# Patient Record
Sex: Male | Born: 1985 | Race: White | Hispanic: Yes | Marital: Single | Smoking: Never smoker
Health system: Southern US, Community
[De-identification: ages and names within clinical notes are randomized; demographics above are authoritative.]

## PROBLEM LIST (undated history)

## (undated) DIAGNOSIS — K5792 Diverticulitis of intestine, part unspecified, without perforation or abscess without bleeding: Secondary | ICD-10-CM

## (undated) DIAGNOSIS — G4730 Sleep apnea, unspecified: Secondary | ICD-10-CM

## (undated) HISTORY — DX: Diverticulitis of intestine, part unspecified, without perforation or abscess without bleeding: K57.92

## (undated) HISTORY — DX: Sleep apnea, unspecified: G47.30

---

## 2019-02-26 ENCOUNTER — Other Ambulatory Visit: Payer: Self-pay

## 2019-02-26 ENCOUNTER — Encounter: Payer: Self-pay | Admitting: Cardiology

## 2019-02-26 ENCOUNTER — Ambulatory Visit (INDEPENDENT_AMBULATORY_CARE_PROVIDER_SITE_OTHER): Payer: Medicaid Other | Admitting: Cardiology

## 2019-02-26 DIAGNOSIS — F209 Schizophrenia, unspecified: Secondary | ICD-10-CM

## 2019-02-26 DIAGNOSIS — R0789 Other chest pain: Secondary | ICD-10-CM

## 2019-02-26 DIAGNOSIS — R9431 Abnormal electrocardiogram [ECG] [EKG]: Secondary | ICD-10-CM

## 2019-02-26 NOTE — Patient Instructions (Signed)
Medication Instructions:  Your physician recommends that you continue on your current medications as directed. Please refer to the Current Medication list given to you today.  *If you need a refill on your cardiac medications before your next appointment, please call your pharmacy*  Lab Work: Your physician recommends that you return for lab work today: Troponin I, lipids   If you have labs (blood work) drawn today and your tests are completely normal, you will receive your results only by: Marland Kitchen MyChart Message (if you have MyChart) OR . A paper copy in the mail If you have any lab test that is abnormal or we need to change your treatment, we will call you to review the results.  Testing/Procedures: Your physician has requested that you have an echocardiogram. Echocardiography is a painless test that uses sound waves to create images of your heart. It provides your doctor with information about the size and shape of your heart and how well your heart's chambers and valves are working. This procedure takes approximately one hour. There are no restrictions for this procedure.  Non-Cardiac CT scanning, (CAT scanning), is a noninvasive, special x-ray that produces cross-sectional images of the body using x-rays and a computer. CT scans help physicians diagnose and treat medical conditions. For some CT exams, a contrast material is used to enhance visibility in the area of the body being studied. CT scans provide greater clarity and reveal more details than regular x-ray exams.    Follow-Up: At [facility], you and your health needs are our priority.  As part of our continuing mission to provide you with exceptional heart care, we have created designated Provider Care Teams.  These Care Teams include your primary Cardiologist (physician) and Advanced Practice Providers (APPs -  Physician Assistants and Nurse Practitioners) who all work together to provide you with the care you need, when you need it.   Your next appointment:   6 week fu   The format for your next appointment:   In Person  Provider:   You will see Dr. Bing Matter.  Or, you can be scheduled with the following Advanced Practice Provider on your designated Care Team (at our [facility]):  Gillian Shields, FNP    Other Instructions   Echocardiogram An echocardiogram is a procedure that uses painless sound waves (ultrasound) to produce an image of the heart. Images from an echocardiogram can provide important information about:  Signs of coronary artery disease (CAD).  Aneurysm detection. An aneurysm is a weak or damaged part of an artery wall that bulges out from the normal force of blood pumping through the body.  Heart size and shape. Changes in the size or shape of the heart can be associated with certain conditions, including heart failure, aneurysm, and CAD.  Heart muscle function.  Heart valve function.  Signs of a past heart attack.  Fluid buildup around the heart.  Thickening of the heart muscle.  A tumor or infectious growth around the heart valves. Tell a health care provider about:  Any allergies you have.  All medicines you are taking, including vitamins, herbs, eye drops, creams, and over-the-counter medicines.  Any blood disorders you have.  Any surgeries you have had.  Any medical conditions you have.  Whether you are pregnant or may be pregnant. What are the risks? Generally, this is a safe procedure. However, problems may occur, including:  Allergic reaction to dye (contrast) that may be used during the procedure. What happens before the procedure? No  specific preparation is needed. You may eat and drink normally. What happens during the procedure?   An IV tube may be inserted into one of your veins.  You may receive contrast through this tube. A contrast is an injection that improves the quality of the pictures from your heart.  A gel will be applied to your chest.   A wand-like tool (transducer) will be moved over your chest. The gel will help to transmit the sound waves from the transducer.  The sound waves will harmlessly bounce off of your heart to allow the heart images to be captured in real-time motion. The images will be recorded on a computer. The procedure may vary among health care providers and hospitals. What happens after the procedure?  You may return to your normal, everyday life, including diet, activities, and medicines, unless your health care provider tells you not to do that. Summary  An echocardiogram is a procedure that uses painless sound waves (ultrasound) to produce an image of the heart.  Images from an echocardiogram can provide important information about the size and shape of your heart, heart muscle function, heart valve function, and fluid buildup around your heart.  You do not need to do anything to prepare before this procedure. You may eat and drink normally.  After the echocardiogram is completed, you may return to your normal, everyday life, unless your health care provider tells you not to do that. This information is not intended to replace advice given to you by your health care provider. Make sure you discuss any questions you have with your health care provider. Document Released: 04/14/2000 Document Revised: 08/08/2018 Document Reviewed: 05/20/2016 Elsevier Patient Education  2020 [employer].

## 2019-02-26 NOTE — Addendum Note (Signed)
Addended by: Ashok Norris on: 02/26/2019 11:26 AM   Modules accepted: Orders

## 2019-02-26 NOTE — Addendum Note (Signed)
Addended by: Ashok Norris on: 02/26/2019 11:19 AM   Modules accepted: Orders

## 2019-02-26 NOTE — Progress Notes (Signed)
Cardiology Consultation:    Date:  02/26/2019   ID:  David Ho, DOB 10-06-1985, MRN [id number]  PCP:  Maryella Shivers, MD  Cardiologist:  Jenne Campus, MD   Referring MD: No ref. provider found   Chief Complaint  Patient presents with  . Abnormal ECG  Have abnormal EKG and chest pain  History of Present Illness:    David Ho is a 33 y.o. male who is being seen today for the evaluation of chest pain at the request of No ref. provider found.  He was referred to [place] by Dr. Maryella Shivers.  Apparently 2 days ago he woke up in the middle of night with some chest pain so is somewhat difficult to obtain he comes to the office with his sister.  He said pain was only 1 on a scale up to 10 but I am not sure if he understand the concept of scaling of the pain, he said he woke up in the middle of the night with the sensation he did have some sweating.  Tired sensation lasted for few minutes and then he went back to sleep.  I also yesterday his sister noticed one moment that he was kind of pale and he was complaining of having some uneasy sensation in the chest.  He took nitroglycerin with relief.  He cannot tell me any details about the pain.  He is able to walk climb stairs with no difficulties.  He complained of having some back pain as well as some leg pain.  Very nonspecific symptoms.  His EKG showed possibility of inferior wall myocardial infarction no acute changes.Marland Kitchen  He never had any heart trouble.  Diagnosis of schizophrenia.  Never smoked does not have family history of premature coronary disease.  He does not know what his cholesterol is.  No past medical history on file.    Current Medications: Current Meds  Medication Sig  . citalopram (CELEXA) 20 MG tablet Take 20 mg by mouth daily.  . divalproex (DEPAKOTE) 250 MG DR tablet Take 250 mg by mouth 2 (two) times daily.  . nitroGLYCERIN (NITROSTAT) 0.4 MG SL tablet Place 0.4 mg under the tongue every 5 (five) minutes as  needed for chest pain.  . paliperidone (INVEGA) 9 MG 24 hr tablet Take 9 mg by mouth every morning.     Allergies:   Patient has no known allergies.   Social History   Socioeconomic History  . Marital status: Single    Spouse name: Not on file  . Number of children: Not on file  . Years of education: Not on file  . Highest education level: Not on file  Occupational History  . Not on file  Social Needs  . Financial resource strain: Not on file  . Food insecurity    Worry: Not on file    Inability: Not on file  . Transportation needs    Medical: Not on file    Non-medical: Not on file  Tobacco Use  . Smoking status: Never Smoker  . Smokeless tobacco: Never Used  Substance and Sexual Activity  . Alcohol use: Never    Frequency: Never  . Drug use: Never  . Sexual activity: Not on file  Lifestyle  . Physical activity    Days per week: Not on file    Minutes per session: Not on file  . Stress: Not on file  Relationships  . Social connections    Talks on phone: Not on file  Gets together: Not on file    Attends religious service: Not on file    Active member of club or organization: Not on file    Attends meetings of clubs or organizations: Not on file    Relationship status: Not on file  Other Topics Concern  . Not on file  Social History Narrative  . Not on file     Family History: The patient's family history includes Diabetes in his maternal grandmother; Hyperlipidemia in his father; Hypertension in his mother. ROS:   Please see the history of present illness.    All 14 point review of systems negative except as described per history of present illness.  EKGs/Labs/Other Studies Reviewed:    The following studies were reviewed today: Normal sinus rhythm, normal P interval, Q waves inferiorly nonspecific ST segment changes.  Right axis deviation.    Recent Labs: No results found for requested labs within last 8760 hours.  Recent Lipid Panel No results  found for: CHOL, TRIG, HDL, CHOLHDL, VLDL, LDLCALC, LDLDIRECT  Physical Exam:    VS:  BP 124/70   Pulse 94   Ht 5\' 4"  (1.626 m)   Wt 208 lb (94.3 kg)   SpO2 97%   BMI 35.70 kg/m     Wt Readings from Last 3 Encounters:  02/26/19 208 lb (94.3 kg)     GEN:  Well nourished, well developed in no acute distress HEENT: Normal NECK: No JVD; No carotid bruits LYMPHATICS: No lymphadenopathy CARDIAC: RRR, no murmurs, no rubs, no gallops RESPIRATORY:  Clear to auscultation without rales, wheezing or rhonchi  ABDOMEN: Soft, non-tender, non-distended MUSCULOSKELETAL:  No edema; No deformity  SKIN: Warm and dry NEUROLOGIC:  Alert and oriented x 3 PSYCHIATRIC:  Normal affect   ASSESSMENT:    1. Atypical chest pain   2. Nonspecific abnormal electrocardiogram (ECG) (EKG)   3. Schizophrenia, unspecified type (HCC)    PLAN:    In order of problems listed above:  1. Atypical chest pain.  Had a long discussion about that.  Asking to start taking 1 baby aspirin every single day.  He will be scheduled to have an echocardiogram to see if truly he got inferior wall MI which I doubt very much.  Troponin I will be done.  His pain is very nonspecific I think the best way to assess this situation will be to perform calcium score.  Until then I told him not to overexert himself.  He does have nitroglycerin he knows how to use it and asked him to keep using it and let 02/28/19 know if he required nitroglycerin. 2. Abnormal EKG again echocardiogram will be done to assess left ventricle and more importantly right ventricle size and function. 3. Schizophrenia will be followed by psychiatry.   Medication Adjustments/Labs and Tests Ordered: Current medicines are reviewed at length with the patient today.  Concerns regarding medicines are outlined above.  No orders of the defined types were placed in this encounter.  No orders of the defined types were placed in this encounter.   Signed, [place],  MD, [facility]. 02/26/2019 10:56 AM    [facility]

## 2019-02-27 LAB — LIPID PANEL
Chol/HDL Ratio: 6.9 ratio — ABNORMAL HIGH (ref 0.0–5.0)
Cholesterol, Total: 180 mg/dL (ref 100–199)
HDL: 26 mg/dL — ABNORMAL LOW (ref >39)
LDL Chol Calc (NIH): 102 mg/dL — ABNORMAL HIGH (ref 0–99)
Triglycerides: 302 mg/dL — ABNORMAL HIGH (ref 0–149)
VLDL Cholesterol Cal: 52 mg/dL — ABNORMAL HIGH (ref 5–40)

## 2019-02-27 LAB — TROPONIN I: Troponin I: <0.01 ng/mL (ref 0.00–0.04)

## 2019-04-07 ENCOUNTER — Ambulatory Visit (INDEPENDENT_AMBULATORY_CARE_PROVIDER_SITE_OTHER): Payer: Medicaid Other

## 2019-04-07 ENCOUNTER — Other Ambulatory Visit: Payer: Self-pay

## 2019-04-07 DIAGNOSIS — R0789 Other chest pain: Secondary | ICD-10-CM

## 2019-04-07 NOTE — Progress Notes (Signed)
Complete Echocardiogram has been performed.  Jimmy Briunna Leicht RDCS, RVT 

## 2019-04-18 ENCOUNTER — Ambulatory Visit: Payer: Medicaid Other | Admitting: Cardiology

## 2019-04-18 ENCOUNTER — Telehealth: Payer: Self-pay | Admitting: *Deleted

## 2019-04-18 NOTE — Telephone Encounter (Signed)
Called patient to discuss echo results and spoke with his sister.  She is aware that he missed his appointment scheduled for today and they would like to reschedule.  I gave her the echo results and sent her to Jannet Askew to schedule his CT.  She is aware that someone will call to reschedule his appointment.

## 2019-04-30 NOTE — Telephone Encounter (Signed)
Scheduled patient for a follow up appointment with his sister per his verbal permission.

## 2019-05-12 ENCOUNTER — Encounter: Payer: Self-pay | Admitting: Gastroenterology

## 2019-05-12 ENCOUNTER — Other Ambulatory Visit: Payer: Self-pay

## 2019-05-12 ENCOUNTER — Ambulatory Visit (INDEPENDENT_AMBULATORY_CARE_PROVIDER_SITE_OTHER)
Admission: RE | Admit: 2019-05-12 | Discharge: 2019-05-12 | Disposition: A | Discharge status: Home / Self Care | Payer: Self-pay | Source: Ambulatory Visit | Attending: Cardiology | Admitting: Cardiology

## 2019-05-12 DIAGNOSIS — R0789 Other chest pain: Secondary | ICD-10-CM

## 2019-05-15 ENCOUNTER — Ambulatory Visit: Payer: Medicaid Other | Admitting: Cardiology

## 2019-05-20 ENCOUNTER — Ambulatory Visit (INDEPENDENT_AMBULATORY_CARE_PROVIDER_SITE_OTHER): Payer: Medicaid Other | Admitting: Cardiology

## 2019-05-20 ENCOUNTER — Other Ambulatory Visit: Payer: Self-pay

## 2019-05-20 ENCOUNTER — Encounter: Payer: Self-pay | Admitting: Cardiology

## 2019-05-20 VITALS — BP 118/80 | HR 82 | Ht 64.0 in | Wt 201.0 lb

## 2019-05-20 DIAGNOSIS — F209 Schizophrenia, unspecified: Secondary | ICD-10-CM

## 2019-05-20 DIAGNOSIS — R9431 Abnormal electrocardiogram [ECG] [EKG]: Secondary | ICD-10-CM | POA: Diagnosis not present

## 2019-05-20 DIAGNOSIS — R0789 Other chest pain: Secondary | ICD-10-CM

## 2019-05-20 NOTE — Progress Notes (Signed)
Cardiology Office Note:    Date:  05/20/2019   ID:  David Ho, DOB [birth year], MRN [id number]  PCP:  Charlott Rakes, MD  Cardiologist:  Gypsy Balsam, MD    Referring MD: Charlott Rakes, MD   Chief Complaint  Patient presents with  . Chest Pain  Doing well  History of Present Illness:    David Ho is a 34 y.o. male who was referred to [place] because of atypical chest pain.  Since the time of seeing him last time he denies having any chest pain he is active he can walk climb stairs with no difficulty there is no chest pain tightness squeezing pressure burning chest.  He did have echocardiogram which showed normal left ventricle ejection fraction without segmental wall motion abnormalities.  He also did have a calcium score which showed 0.  Of course sensitivity of this test for somebody his age is much less than somebody with more than 33 years old however with lack of symptoms right now I think we can be satisfied.  And I told him if he have more chest pain he needs to let me know in the future if you have some symptoms you may be forced to do stress test for now we will concentrate on risk factors modifications.  I will call his primary care physician to get fasting lipid profile.  He told me that he was diagnosed with slightly high sugar.  We talked about need to exercise as well as proper diet.  No past medical history on file.    Current Medications: No outpatient medications have been marked as taking for the 05/20/19 encounter (Office Visit) with Georgeanna Lea, MD.     Allergies:   Patient has no known allergies.   Social History   Socioeconomic History  . Marital status: Single    Spouse name: Not on file  . Number of children: Not on file  . Years of education: Not on file  . Highest education level: Not on file  Occupational History  . Not on file  Tobacco Use  . Smoking status: Never Smoker  . Smokeless tobacco: Never Used  Substance and Sexual  Activity  . Alcohol use: Never  . Drug use: Never  . Sexual activity: Not on file  Other Topics Concern  . Not on file  Social History Narrative  . Not on file   Social Determinants of Health   Financial Resource Strain:   . Difficulty of Paying Living Expenses: Not on file  Food Insecurity:   . Worried About Programme researcher, broadcasting/film/video in the Last Year: Not on file  . Ran Out of Food in the Last Year: Not on file  Transportation Needs:   . Lack of Transportation (Medical): Not on file  . Lack of Transportation (Non-Medical): Not on file  Physical Activity:   . Days of Exercise per Week: Not on file  . Minutes of Exercise per Session: Not on file  Stress:   . Feeling of Stress : Not on file  Social Connections:   . Frequency of Communication with Friends and Family: Not on file  . Frequency of Social Gatherings with Friends and Family: Not on file  . Attends Religious Services: Not on file  . Active Member of Clubs or Organizations: Not on file  . Attends Banker Meetings: Not on file  . Marital Status: Not on file     Family History: The patient's family history includes Diabetes in his  maternal grandmother; Hyperlipidemia in his father; Hypertension in his mother. ROS:   Please see the history of present illness.    All 14 point review of systems negative except as described per history of present illness  EKGs/Labs/Other Studies Reviewed:      Recent Labs: No results found for requested labs within last 8760 hours.  Recent Lipid Panel    Component Value Date/Time   CHOL 180 02/26/2019 1124   TRIG 302 (H) 02/26/2019 1124   HDL 26 (L) 02/26/2019 1124   CHOLHDL 6.9 (H) 02/26/2019 1124   LDLCALC 102 (H) 02/26/2019 1124    Physical Exam:    VS:  BP 118/80 (BP Location: Left Arm, Patient Position: Sitting, Cuff Size: Large)   Pulse 82   Ht 5\' 4"  (1.626 m)   Wt 201 lb (91.2 kg)   SpO2 95%   BMI 34.50 kg/m     Wt Readings from Last 3 Encounters:    05/20/19 201 lb (91.2 kg)  02/26/19 208 lb (94.3 kg)     GEN:  Well nourished, well developed in no acute distress HEENT: Normal NECK: No JVD; No carotid bruits LYMPHATICS: No lymphadenopathy CARDIAC: RRR, no murmurs, no rubs, no gallops RESPIRATORY:  Clear to auscultation without rales, wheezing or rhonchi  ABDOMEN: Soft, non-tender, non-distended MUSCULOSKELETAL:  No edema; No deformity  SKIN: Warm and dry LOWER EXTREMITIES: no swelling NEUROLOGIC:  Alert and oriented x 3 PSYCHIATRIC:  Normal affect   ASSESSMENT:    1. Atypical chest pain   2. Nonspecific abnormal electrocardiogram (ECG) (EKG)   3. Schizophrenia, unspecified type ([place])    PLAN:    In order of problems listed above:  1. Atypical chest pain.  Denies having any.  Calcium score 0 2. No specific EKG changes however he is echocardiogram showed normal left ventricle ejection fraction. 3. Schizophrenia.  Noted followed by internal medicine team.  See him back in 5 months or sooner if any other problem   Medication Adjustments/Labs and Tests Ordered: Current medicines are reviewed at length with the patient today.  Concerns regarding medicines are outlined above.  No orders of the defined types were placed in this encounter.  Medication changes: No orders of the defined types were placed in this encounter.   Signed, Park Liter, MD, [facility] 05/20/2019 10:09 AM    [place]

## 2019-05-20 NOTE — Patient Instructions (Signed)

## 2019-05-30 ENCOUNTER — Ambulatory Visit: Payer: Medicaid Other | Admitting: Gastroenterology

## 2019-05-30 ENCOUNTER — Other Ambulatory Visit: Payer: Self-pay

## 2019-05-30 ENCOUNTER — Encounter: Payer: Self-pay | Admitting: Gastroenterology

## 2019-05-30 VITALS — BP 106/76 | HR 78 | Temp 98.2°F | Ht 63.75 in | Wt 199.5 lb

## 2019-05-30 DIAGNOSIS — R1032 Left lower quadrant pain: Secondary | ICD-10-CM

## 2019-05-30 DIAGNOSIS — K5900 Constipation, unspecified: Secondary | ICD-10-CM | POA: Diagnosis not present

## 2019-05-30 NOTE — Progress Notes (Signed)
Chief Complaint:   Referring Provider:  Charlott Rakes, MD      ASSESSMENT AND PLAN;   #1. LLQ pain (resolved).  Presumed diverticulitis.  Treated with Bactrim with good results.  #2.  Chronic constipation.  Plan: - Continue miralax 17g po q MWF. If still with constipation, would take MiraLAX every day. -Please obtain previous records from Dr. Charlott Rakes. -FU in 6 months. -Would like to hold off on colonoscopy since he is not having any red flag symptoms at the present time.  However, if in the future if he has any problems we can always reconsider.  Have discussed above in detail with the patient and patient's family through the interpreter.   HPI:    David Ho is a 34 y.o. male  With mild mental retardation/schizophrenia No records available at the present time Had LLQ pain x 4 to 6 weeks ago associated with constipation.  He was treated with Bactrim for 10 days for presumed diverticulitis.  No CT was performed.  This does result in complete improvement of abdominal pain. He does give a longstanding history of constipation which is better now, after taking MiraLAX.  Still would have hard stools at the frequency of 3-4 times per week. Denies having any melena or hematochezia No previous similar problems No weight loss Denies having any upper GI symptoms including nausea, vomiting, heartburn, regurgitation odynophagia or dysphagia.  Had chest pains, resolved with Protonix.  Negative cardiac evaluation.  Followed by Dr. Charlott Rakes closely.  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 Past Medical History:  Diagnosis Date  . Diverticulitis   . Sleep apnea     Past Surgical History:  Procedure Laterality Date  . NO PAST SURGERIES      Family History  Problem Relation Age of Onset  . Hypertension Mother   . Hyperlipidemia Father   . Diabetes Maternal Grandmother   . Prostate cancer Maternal Grandfather   . Colon cancer Neg Hx     . Esophageal cancer Neg Hx     Social History   Tobacco Use  . Smoking status: Never Smoker  . Smokeless tobacco: Never Used  Substance Use Topics  . Alcohol use: Never  . Drug use: Never    Current Outpatient Medications  Medication Sig Dispense Refill  . citalopram (CELEXA) 20 MG tablet Take 20 mg by mouth daily.    . divalproex (DEPAKOTE) 250 MG DR tablet Take 250 mg by mouth daily.     . paliperidone (INVEGA) 9 MG 24 hr tablet Take 9 mg by mouth every morning.    . pantoprazole (PROTONIX) 40 MG tablet Take 40 mg by mouth daily.    . nitroGLYCERIN (NITROSTAT) 0.4 MG SL tablet Place 0.4 mg under the tongue every 5 (five) minutes as needed for chest pain.     No current facility-administered medications for this visit.    Allergies  Allergen Reactions  . Carrot Oil   . Milk-Related Compounds   . Other     Cat and horse  . Shrimp [Shellfish Allergy]   . Tuna [Fish Allergy]     Review of Systems:  Constitutional: Denies fever, chills, diaphoresis, appetite change and fatigue.  HEENT: Denies photophobia, eye pain, redness, hearing loss, ear pain, congestion, sore throat, rhinorrhea, sneezing, mouth sores, neck pain, neck stiffness and tinnitus.   Respiratory: Denies SOB, DOE, cough, chest tightness,  and wheezing.   Cardiovascular: Had chest pain-negative cardiac eval by Dr.  K Genitourinary: Denies dysuria, urgency, frequency, hematuria, flank pain and difficulty urinating.  Musculoskeletal: Denies myalgias, back pain, joint swelling, arthralgias and gait problem.  Skin: No rash.  Neurological: Denies dizziness, seizures, syncope, weakness, light-headedness, numbness and headaches.  Hematological: Denies adenopathy. Easy bruising, personal or family bleeding history  Psychiatric/Behavioral: has anxiety or depression.  Mild mental retardation     Physical Exam:    BP 106/76   Pulse 78   Temp 98.2 F (36.8 C)   Ht 5' 3.75" (1.619 m)   Wt 199 lb 8 oz (90.5 kg)    BMI 34.51 kg/m  Wt Readings from Last 3 Encounters:  05/30/19 199 lb 8 oz (90.5 kg)  05/20/19 201 lb (91.2 kg)  02/26/19 208 lb (94.3 kg)   Constitutional:  Well-developed, in no acute distress. Psychiatric: Normal mood and affect. Behavior is normal. HEENT: Pupils normal.  Conjunctivae are normal. No scleral icterus. Neck supple.  Cardiovascular: Normal rate, regular rhythm. No edema Pulmonary/chest: Effort normal and breath sounds normal. No wheezing, rales or rhonchi. Abdominal: Soft, nondistended. Nontender. Bowel sounds active throughout. There are no masses palpable. No hepatomegaly. Rectal:  defered Neurological: Alert and oriented to person place and time. Skin: Skin is warm and dry. No rashes noted.    Radiology Studies: CT CARDIAC SCORING  Addendum Date: 05/12/2019   ADDENDUM REPORT: 05/12/2019 17:13 CLINICAL DATA:  Risk stratification EXAM: Coronary Calcium Score TECHNIQUE: The patient was scanned on a Siemens Somatom 64 slice scanner. Axial non-contrast 3 mm slices were carried out through the heart. The data set was analyzed on a dedicated work station and scored using the [place]. FINDINGS: Non-cardiac: See separate report from [facility]. Ascending aorta: Normal size Pericardium: Normal Coronary arteries: Normal origin IMPRESSION: Coronary calcium score of 0. This was 0 percentile for age and sex matched control. Kirk Ruths Electronically Signed   By: Kirk Ruths M.D.   On: 05/12/2019 17:13   Result Date: 05/12/2019 EXAM: OVER-READ INTERPRETATION  CT CHEST The following report is an over-read performed by radiologist Dr. Vinnie Langton of [facility], [place] on 05/12/2019. This over-read does not include interpretation of cardiac or coronary anatomy or pathology. The coronary calcium score interpretation by the cardiologist is attached. COMPARISON:  None. FINDINGS: Within the visualized portions of the thorax there are no suspicious appearing  pulmonary nodules or masses, there is no acute consolidative airspace disease, no pleural effusions, no pneumothorax and no lymphadenopathy. Visualized portions of the upper abdomen are unremarkable. There are no aggressive appearing lytic or blastic lesions noted in the visualized portions of the skeleton. IMPRESSION: 1. No significant incidental noncardiac findings are noted. Electronically Signed: By: Vinnie Langton M.D. On: 05/12/2019 16:46      Carmell Austria, MD 05/30/2019, 10:18 AM  Cc: Maryella Shivers, MD

## 2019-05-30 NOTE — Patient Instructions (Signed)
If you are age 33 or older, your body mass index should be between 23-30. Your Body mass index is 34.51 kg/m. If this is out of the aforementioned range listed, please consider follow up with your Primary Care Provider.  If you are age 37 or younger, your body mass index should be between 19-25. Your Body mass index is 34.51 kg/m. If this is out of the aformentioned range listed, please consider follow up with your Primary Care Provider.   Please purchase the following medications over the counter and take as directed: Miralax 17 grams on Monday, Wednesday and Friday. If constipation take once daily.   Follow up in 6 months.   Thank you,  Dr. Lynann Bologna

## 2021-09-06 IMAGING — CT CT HEART SCORING
2 series · 16 of 20 positions shown, 18 images · non-contrast
Comparison: None.
COMPARISON: None.

Addendum:
EXAM:
OVER-READ INTERPRETATION  CT CHEST

The following report is an over-read performed by radiologist Dr.
Gika Reinecke [REDACTED] on 05/12/2019. This
over-read does not include interpretation of cardiac or coronary
anatomy or pathology. The coronary calcium score interpretation by
the cardiologist is attached.
CLINICAL DATA: Risk stratification
Coronary Calcium Score
TECHNIQUE: The patient was scanned on a Siemens Somatom 64 slice scanner. Axial
non-contrast 3 mm slices were carried out through the heart. The
data set was analyzed on a dedicated work station and scored using
the Agatson method.

[Series 3: casc 3.0 i36f 2 bestdiast 69 % · axial · 0.33mm/px · z∈[-350,-274]mm · 8 of 33 slices shown, 10 images]
[im 4/33  vessel]
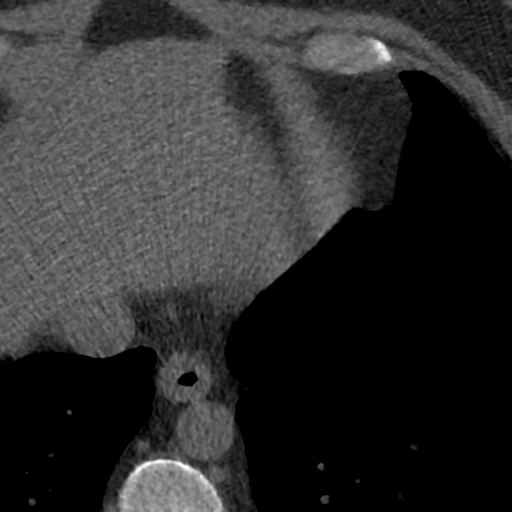
[im 4/33  lung]
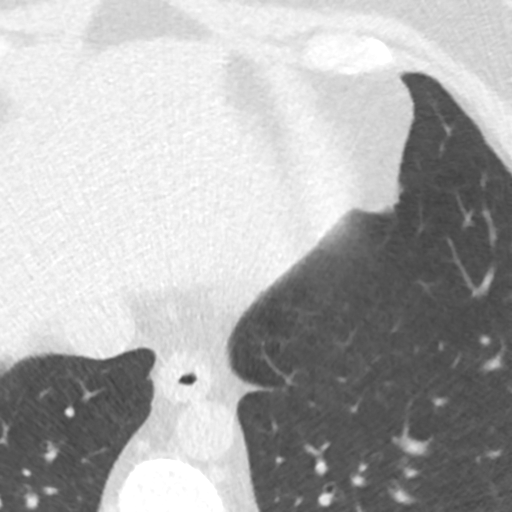
[im 8/33  vessel]
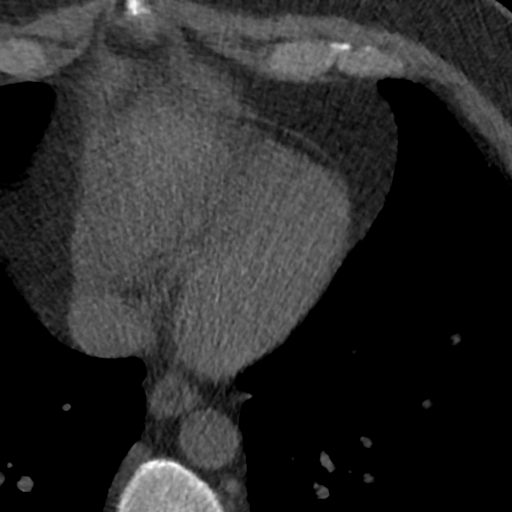
[im 11/33  vessel]
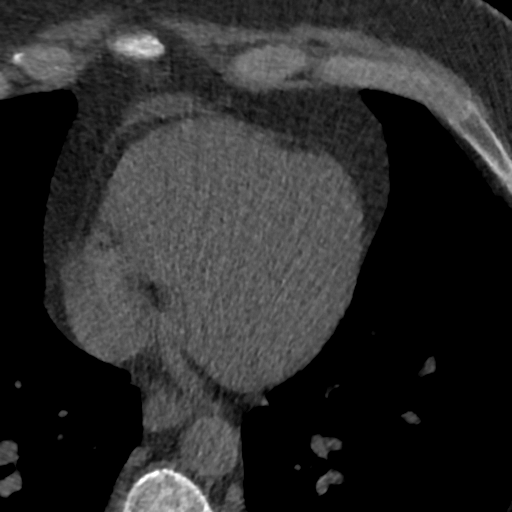
[im 15/33  vessel]
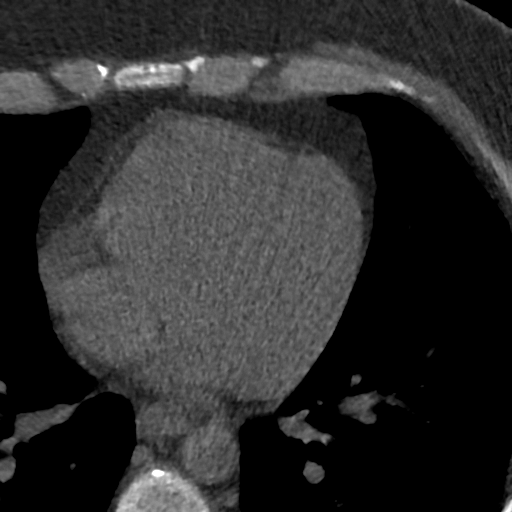
[im 18/33  vessel]
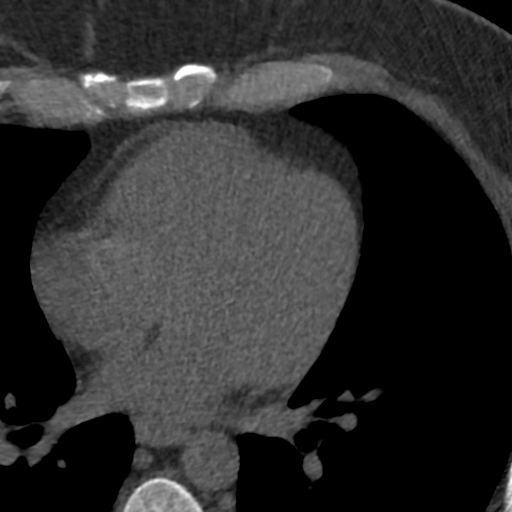
[im 18/33  lung]
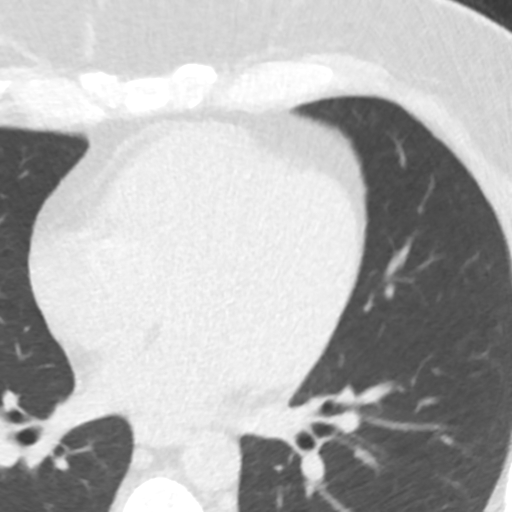
[im 22/33  vessel]
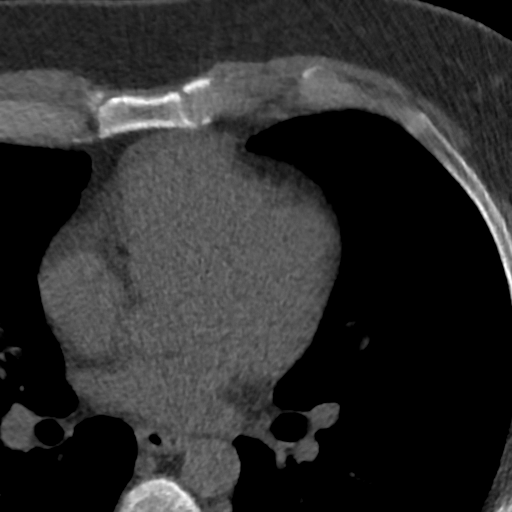
[im 25/33  vessel]
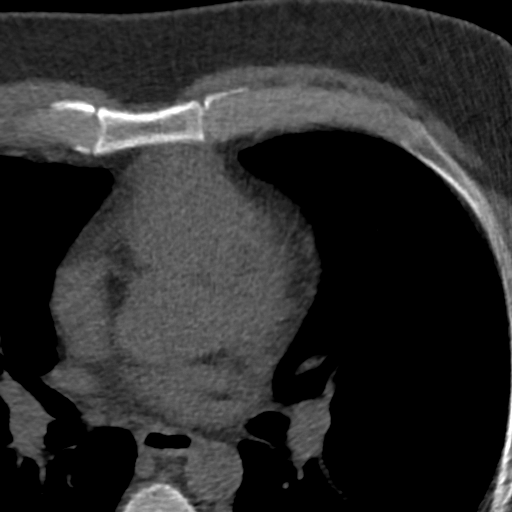
[im 29/33  vessel]
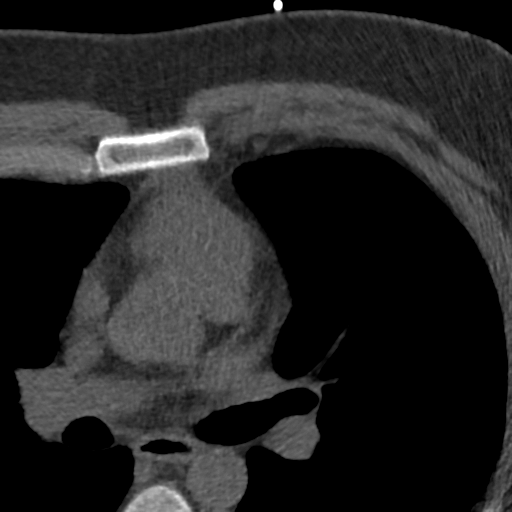

[Series 5: lung st 68 % · axial · 0.68mm/px · z∈[-350,-274]mm · 8 of 33 slices shown]
[im 4/33  lung]
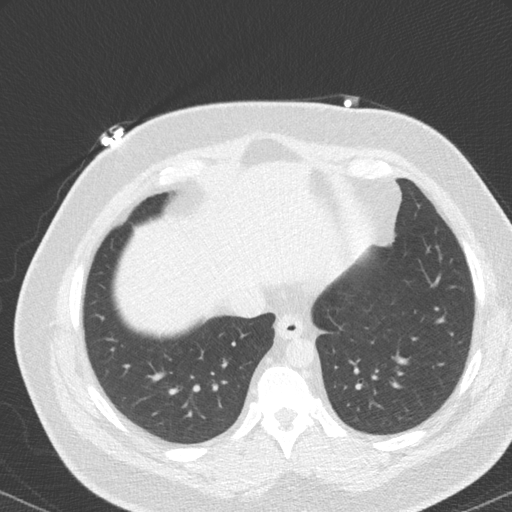
[im 8/33  lung]
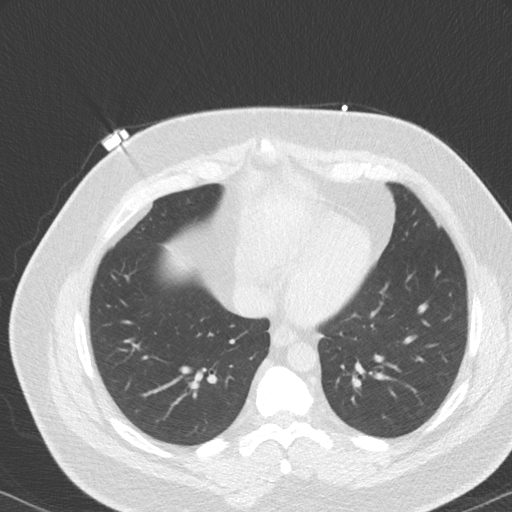
[im 11/33  lung]
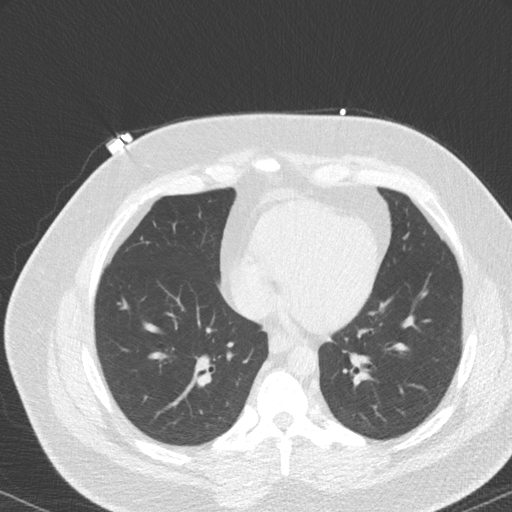
[im 15/33  lung]
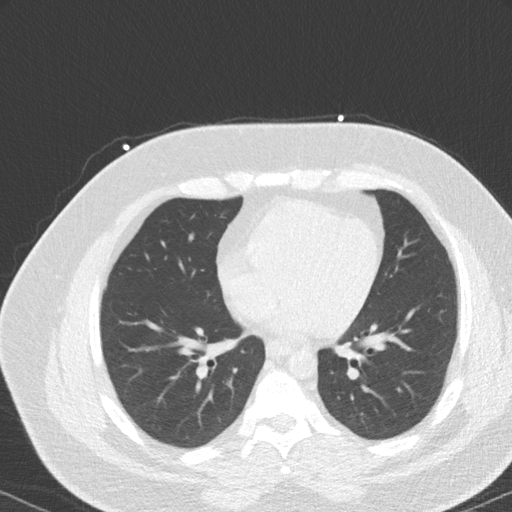
[im 18/33  lung]
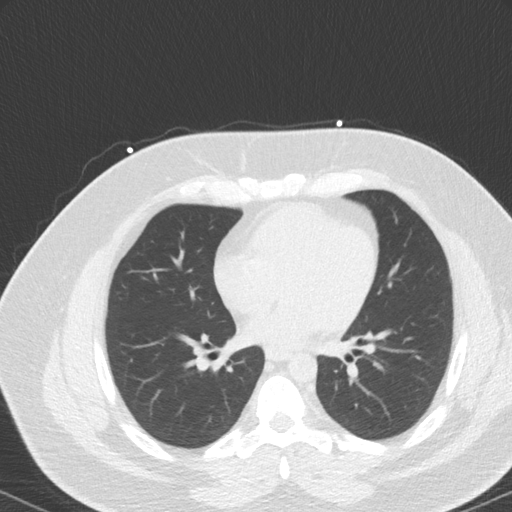
[im 22/33  lung]
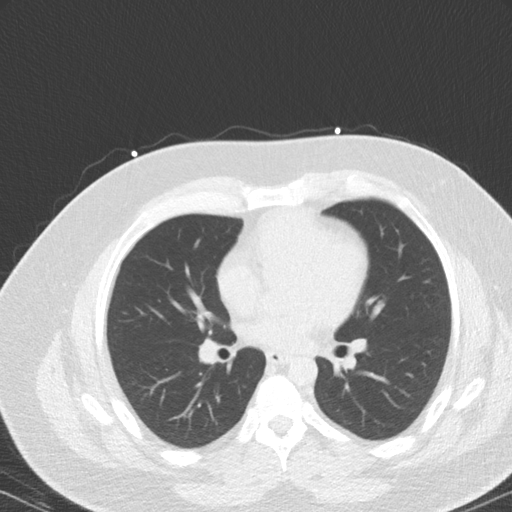
[im 25/33  lung]
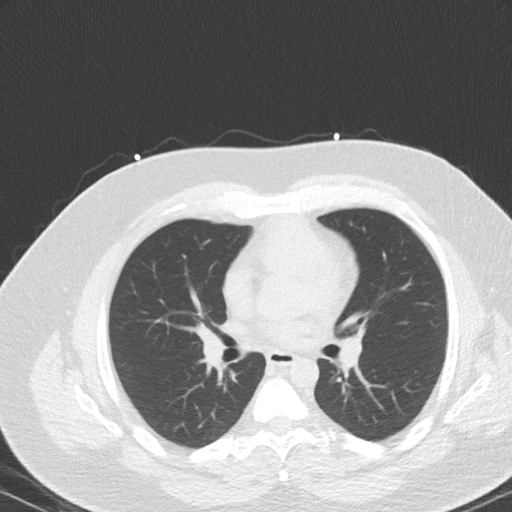
[im 29/33  lung]
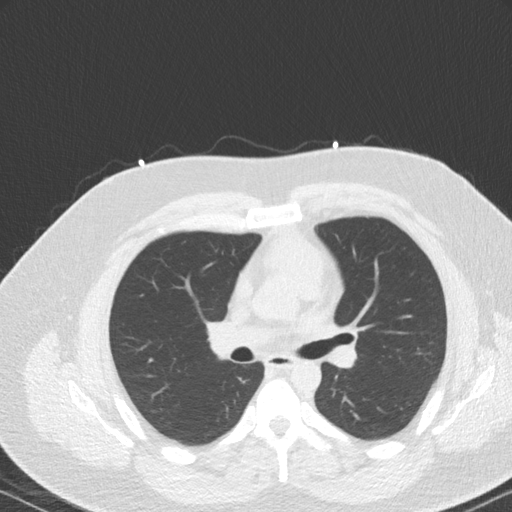

[16 of 20 positions shown; findings below may reference images not displayed]

FINDINGS: Within the visualized portions of the thorax there are no suspicious
appearing pulmonary nodules or masses, there is no acute
consolidative airspace disease, no pleural effusions, no
pneumothorax and no lymphadenopathy. Visualized portions of the
upper abdomen are unremarkable. There are no aggressive appearing
lytic or blastic lesions noted in the visualized portions of the
skeleton.
IMPRESSION: 1. No significant incidental noncardiac findings are noted.
FINDINGS: Non-cardiac: See separate report from [REDACTED].

Ascending aorta: Normal size

Pericardium: Normal

Coronary arteries: Normal origin
IMPRESSION: Coronary calcium score of 0. This was 0 percentile for age and sex
matched control.

Davier Atilano

*** End of Addendum ***
EXAM:
OVER-READ INTERPRETATION  CT CHEST

The following report is an over-read performed by radiologist Dr.
Gika Reinecke [REDACTED] on 05/12/2019. This
over-read does not include interpretation of cardiac or coronary
anatomy or pathology. The coronary calcium score interpretation by
the cardiologist is attached.
FINDINGS: Within the visualized portions of the thorax there are no suspicious
appearing pulmonary nodules or masses, there is no acute
consolidative airspace disease, no pleural effusions, no
pneumothorax and no lymphadenopathy. Visualized portions of the
upper abdomen are unremarkable. There are no aggressive appearing
lytic or blastic lesions noted in the visualized portions of the
skeleton.
IMPRESSION: 1. No significant incidental noncardiac findings are noted.
